# Patient Record
Sex: Male | Born: 1955 | Race: White | Hispanic: No | Marital: Single
Health system: Southern US, Community
[De-identification: ages and names within clinical notes are randomized; demographics above are authoritative.]

## PROBLEM LIST (undated history)

## (undated) DIAGNOSIS — F1010 Alcohol abuse, uncomplicated: Secondary | ICD-10-CM

## (undated) DIAGNOSIS — F209 Schizophrenia, unspecified: Secondary | ICD-10-CM

---

## 2002-05-26 ENCOUNTER — Inpatient Hospital Stay (HOSPITAL_COMMUNITY): Admission: EM | Admit: 2002-05-26 | Discharge: 2002-05-29 | Payer: Self-pay | Admitting: Emergency Medicine

## 2002-05-26 ENCOUNTER — Encounter: Payer: Self-pay | Admitting: Emergency Medicine

## 2002-12-25 ENCOUNTER — Inpatient Hospital Stay (HOSPITAL_COMMUNITY): Admission: EM | Admit: 2002-12-25 | Discharge: 2002-12-28 | Payer: Self-pay | Admitting: Emergency Medicine

## 2003-01-28 ENCOUNTER — Emergency Department (HOSPITAL_COMMUNITY): Admission: EM | Admit: 2003-01-28 | Discharge: 2003-01-28 | Payer: Self-pay | Admitting: Emergency Medicine

## 2003-07-30 ENCOUNTER — Inpatient Hospital Stay (HOSPITAL_COMMUNITY): Admission: EM | Admit: 2003-07-30 | Discharge: 2003-08-01 | Payer: Self-pay | Admitting: Emergency Medicine

## 2003-08-23 ENCOUNTER — Inpatient Hospital Stay (HOSPITAL_COMMUNITY): Admission: EM | Admit: 2003-08-23 | Discharge: 2003-08-27 | Payer: Self-pay | Admitting: Emergency Medicine

## 2004-01-26 ENCOUNTER — Emergency Department (HOSPITAL_COMMUNITY): Admission: EM | Admit: 2004-01-26 | Discharge: 2004-01-27 | Payer: Self-pay | Admitting: Emergency Medicine

## 2004-04-16 ENCOUNTER — Inpatient Hospital Stay (HOSPITAL_COMMUNITY): Admission: EM | Admit: 2004-04-16 | Discharge: 2004-04-19 | Payer: Self-pay | Admitting: *Deleted

## 2004-04-16 ENCOUNTER — Ambulatory Visit: Payer: Self-pay | Admitting: Internal Medicine

## 2004-04-19 ENCOUNTER — Inpatient Hospital Stay: Payer: Self-pay | Admitting: Unknown Physician Specialty

## 2004-05-12 ENCOUNTER — Emergency Department (HOSPITAL_COMMUNITY): Admission: EM | Admit: 2004-05-12 | Discharge: 2004-05-12 | Payer: Self-pay | Admitting: Emergency Medicine

## 2004-05-13 ENCOUNTER — Emergency Department (HOSPITAL_COMMUNITY): Admission: EM | Admit: 2004-05-13 | Discharge: 2004-05-14 | Payer: Self-pay | Admitting: Emergency Medicine

## 2004-05-18 ENCOUNTER — Emergency Department (HOSPITAL_COMMUNITY): Admission: EM | Admit: 2004-05-18 | Discharge: 2004-05-18 | Payer: Self-pay | Admitting: Emergency Medicine

## 2004-06-14 ENCOUNTER — Emergency Department (HOSPITAL_COMMUNITY): Admission: EM | Admit: 2004-06-14 | Discharge: 2004-06-14 | Payer: Self-pay | Admitting: Emergency Medicine

## 2004-07-02 ENCOUNTER — Emergency Department (HOSPITAL_COMMUNITY): Admission: EM | Admit: 2004-07-02 | Discharge: 2004-07-02 | Payer: Self-pay | Admitting: Emergency Medicine

## 2005-01-10 ENCOUNTER — Emergency Department (HOSPITAL_COMMUNITY): Admission: EM | Admit: 2005-01-10 | Discharge: 2005-01-10 | Payer: Self-pay | Admitting: Emergency Medicine

## 2005-06-17 ENCOUNTER — Ambulatory Visit: Payer: Self-pay | Admitting: Internal Medicine

## 2005-06-30 ENCOUNTER — Ambulatory Visit (HOSPITAL_BASED_OUTPATIENT_CLINIC_OR_DEPARTMENT_OTHER): Admission: RE | Admit: 2005-06-30 | Discharge: 2005-06-30 | Payer: Self-pay | Admitting: Internal Medicine

## 2005-07-06 ENCOUNTER — Ambulatory Visit: Payer: Self-pay | Admitting: Internal Medicine

## 2005-07-15 ENCOUNTER — Ambulatory Visit: Payer: Self-pay | Admitting: Internal Medicine

## 2005-08-28 ENCOUNTER — Ambulatory Visit: Payer: Self-pay | Admitting: Internal Medicine

## 2007-01-25 IMAGING — CR DG CHEST 2V
3 series · 3 of 3 positions shown · non-contrast
Comparison: 04/18/04.

CLINICAL DATA: Shortness of breath and altered level of consciousness. 
 CHEST - 2 VIEW:

[view not recorded (1 of 3)]
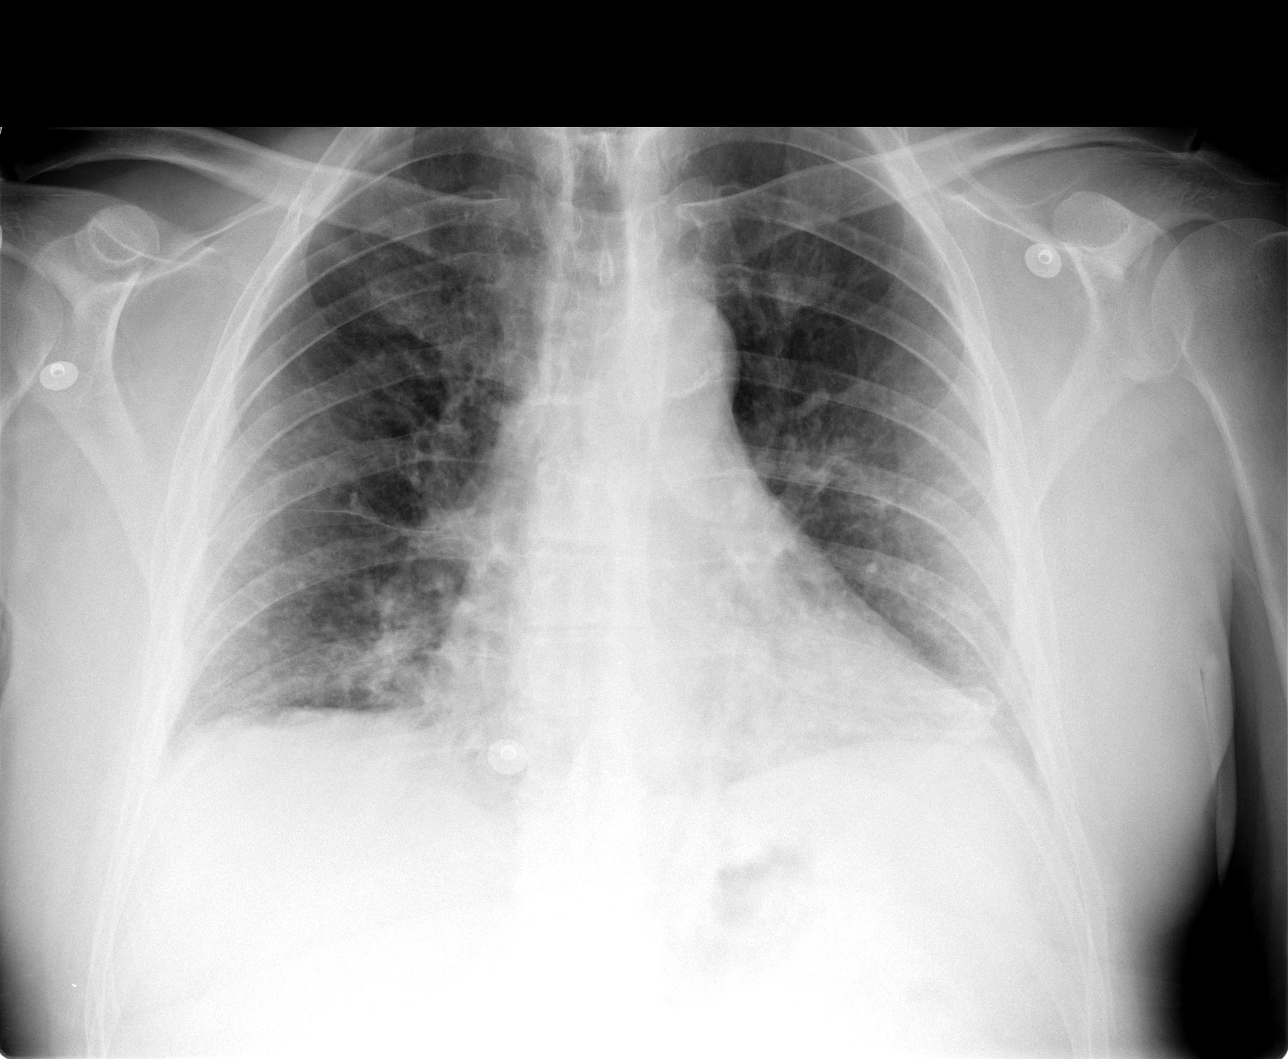

[view not recorded (2 of 3)]
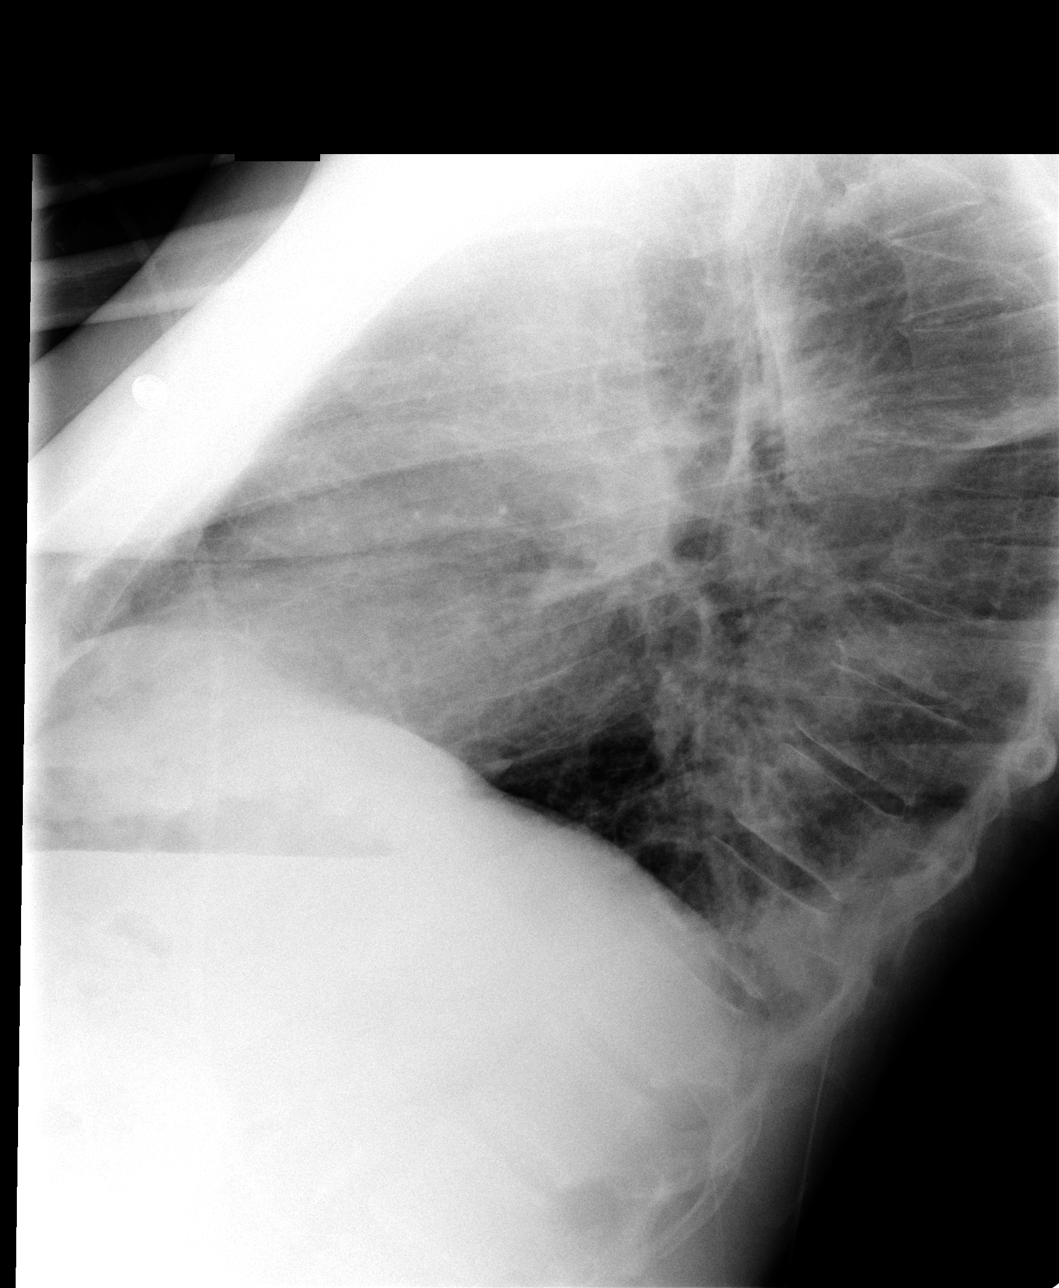

[view not recorded (3 of 3)]
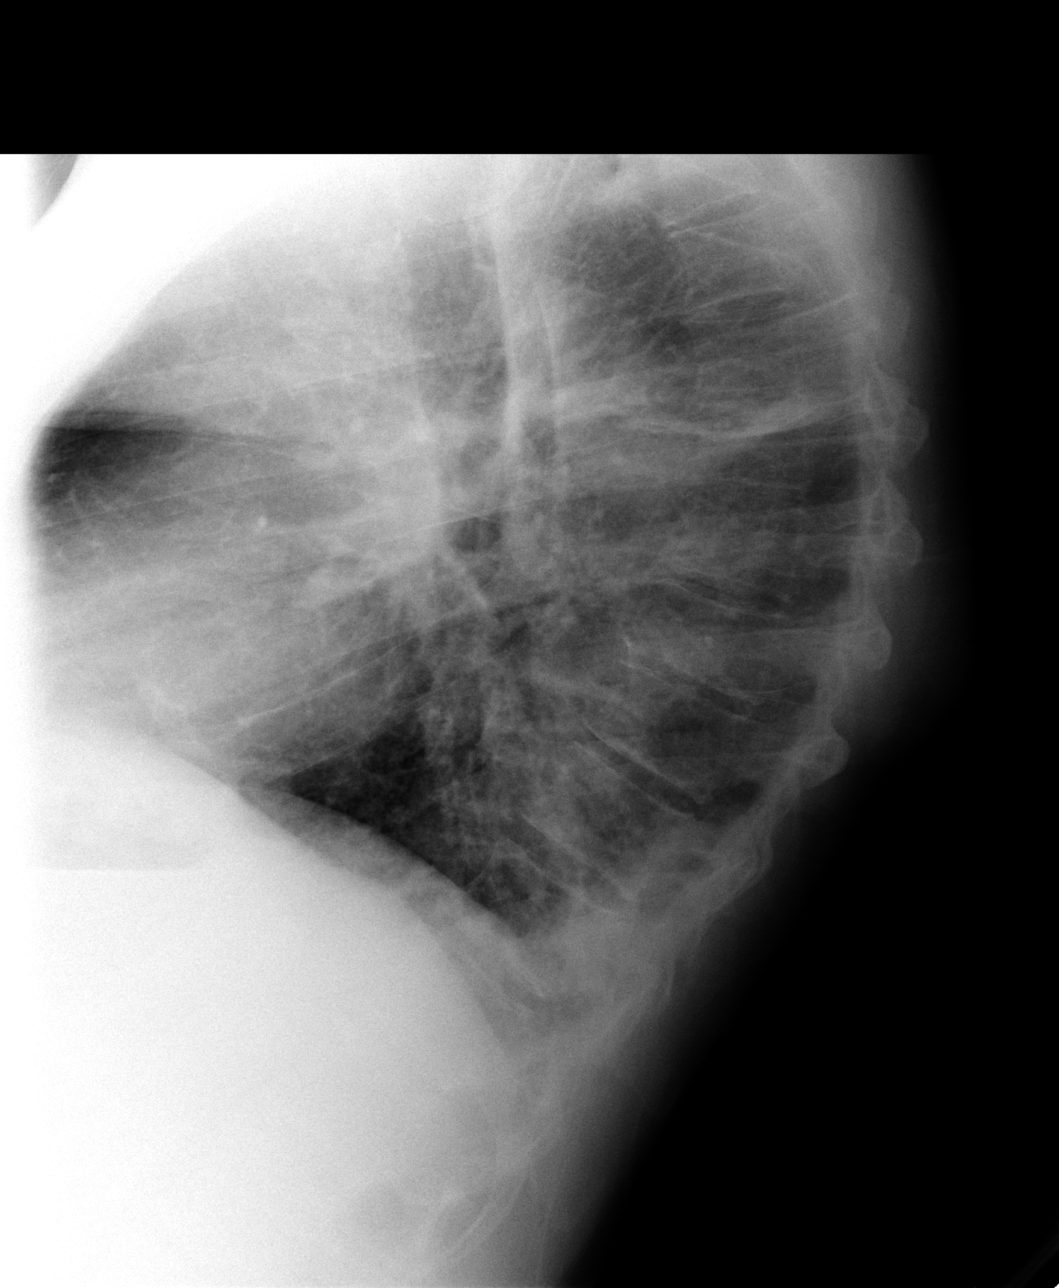

[3 of 3 positions shown; findings below may reference images not displayed]

FINDINGS: The heart is at the upper limits of normal in size.  Pulmonary vascularity is normal.  Intersitial markings are diffusely accentuated on a chronic basis, but appear slightly more accentuated than previously.  There is a focal area of increased density posteriorly, probably on the right which is new since 04/16/04 and may represent an acute infiltrate. 
 The patient also appears to have a small hiatal hernia.
IMPRESSION: Fairly severe chronic interstitial lung disease.  Focal area of probable infiltrate posteriorly on the lateral view.  Follow-up is recommended to ensure clearing since this could represent an ill-defined mass as well.

## 2007-01-25 IMAGING — CT CT HEAD W/O CM
1 of 2 series · 16 of 30 positions shown, 20 images · IV contrast (agent unspecified)
Comparison: 04/16/04.

CLINICAL DATA: Altered level of consciousness.  Chest pain.  
 HEAD CT WITHOUT CONTRAST:
TECHNIQUE: Contiguous axial images were obtained from the base of the skull through the vertex according to standard protocol without contrast.

[Series 3: routine head 4.8 h45s · axial · 0.47mm/px · z∈[-131,-5]mm · 16 of 30 slices shown, 20 images]
[im 2/30  brain]
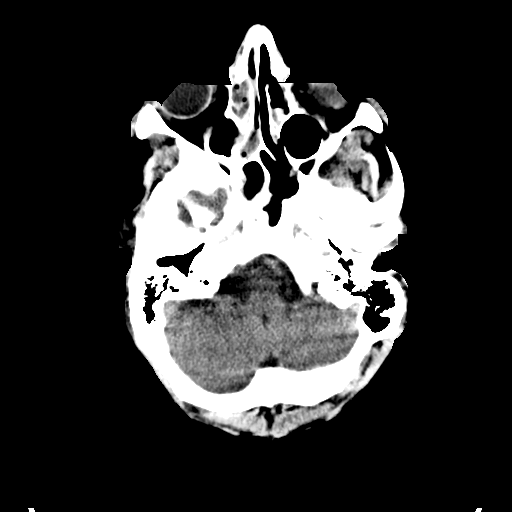
[im 2/30  bone]
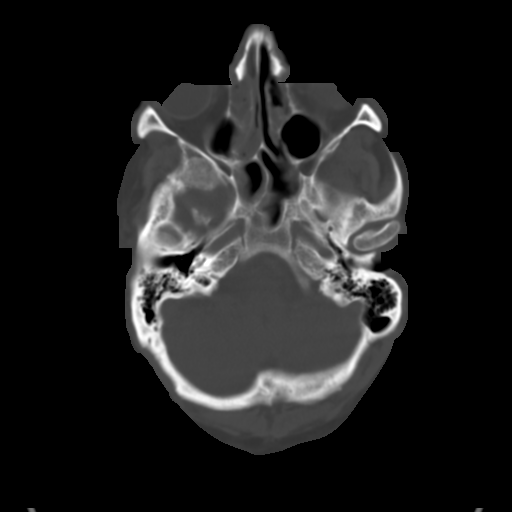
[im 4/30  brain]
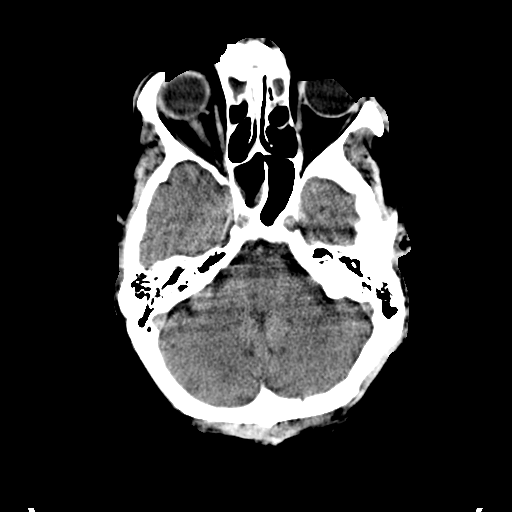
[im 5/30  brain]
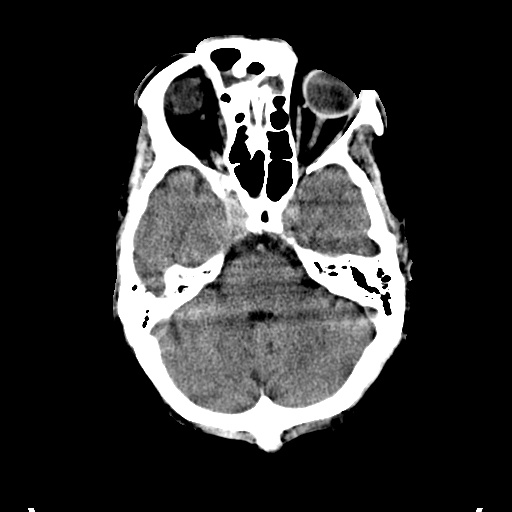
[im 8/30  brain]
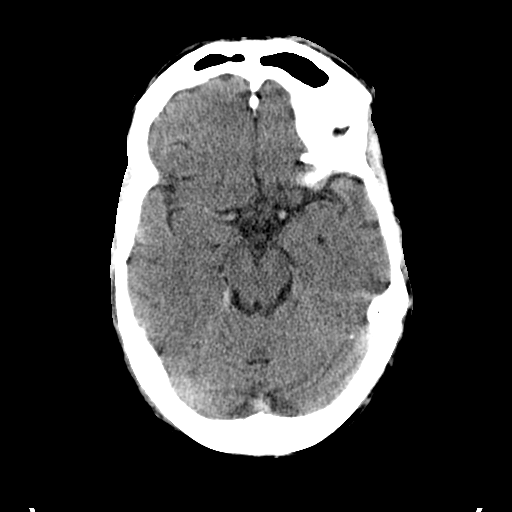
[im 9/30  brain]
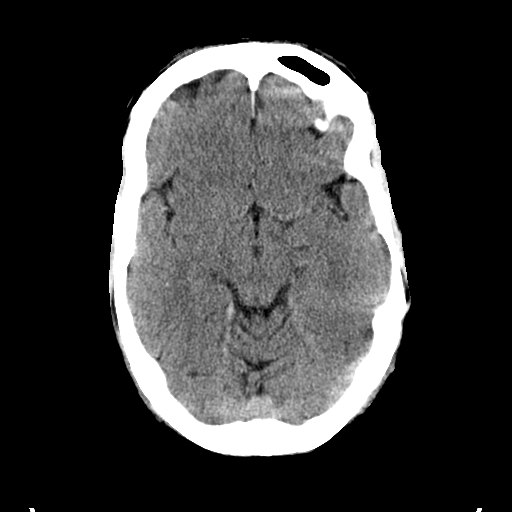
[im 9/30  bone]
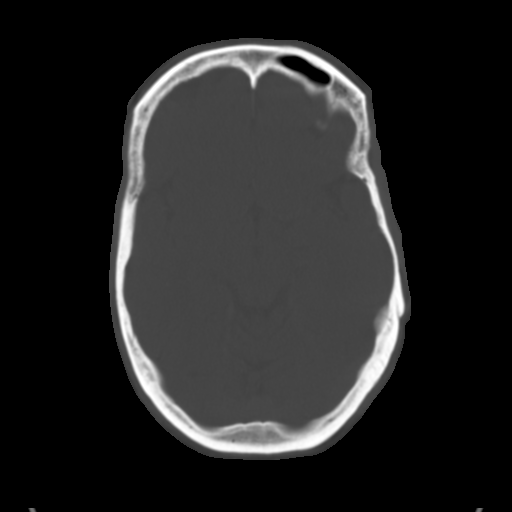
[im 10/30  brain]
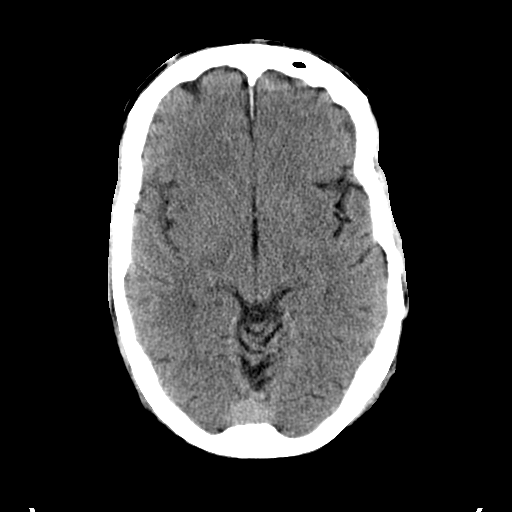
[im 13/30  brain]
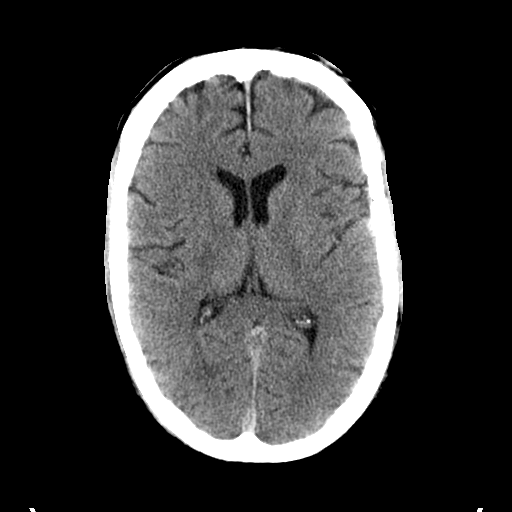
[im 14/30  brain]
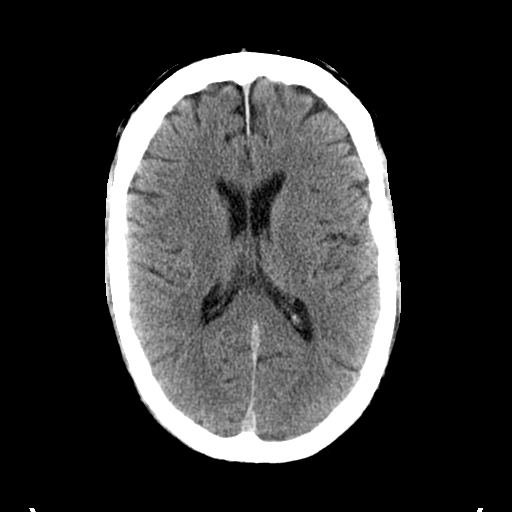
[im 16/30  brain]
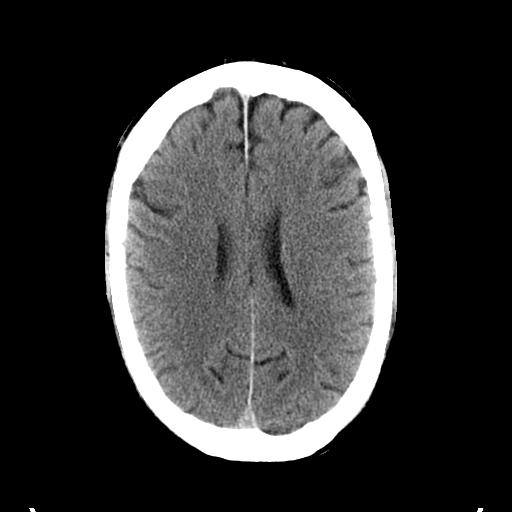
[im 16/30  bone]
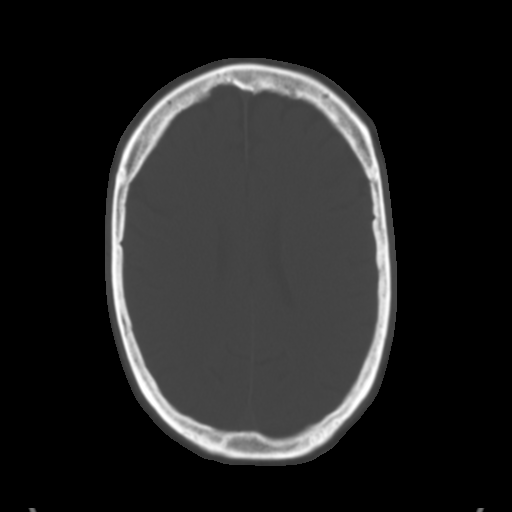
[im 17/30  brain]
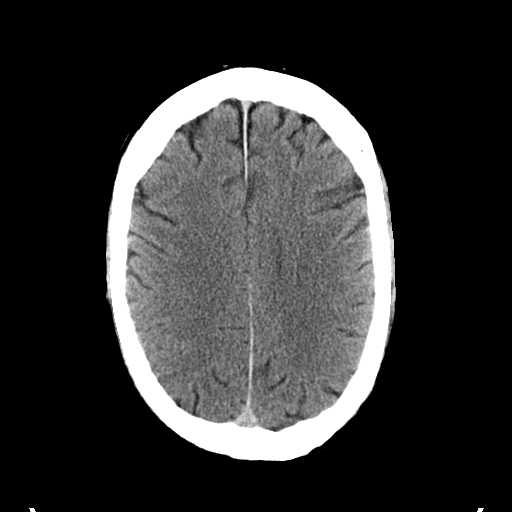
[im 20/30  brain]
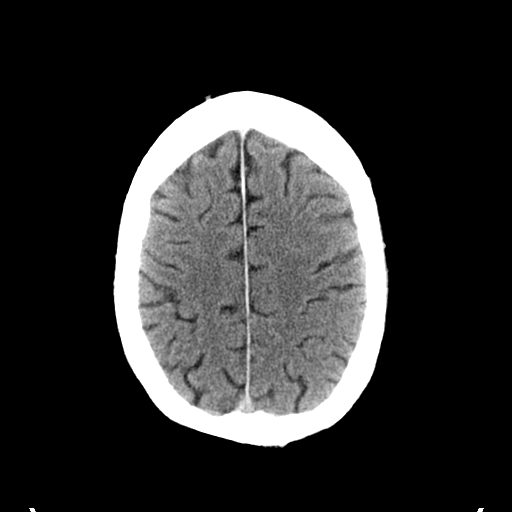
[im 21/30  brain]
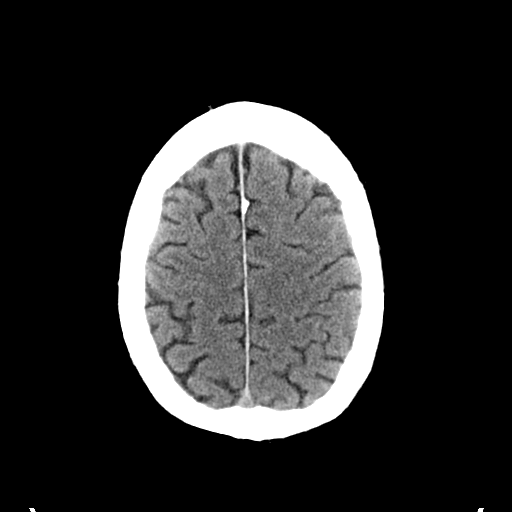
[im 22/30  brain]
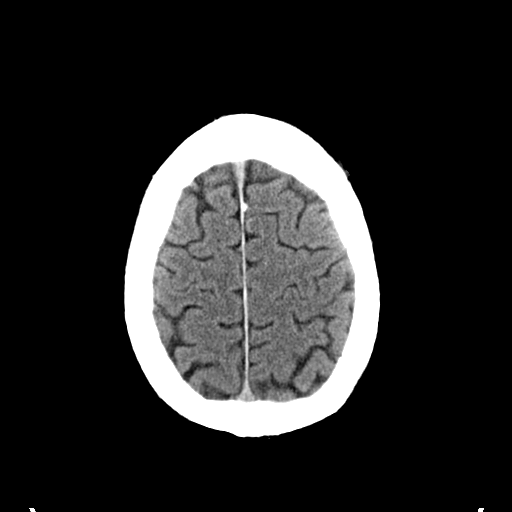
[im 22/30  bone]
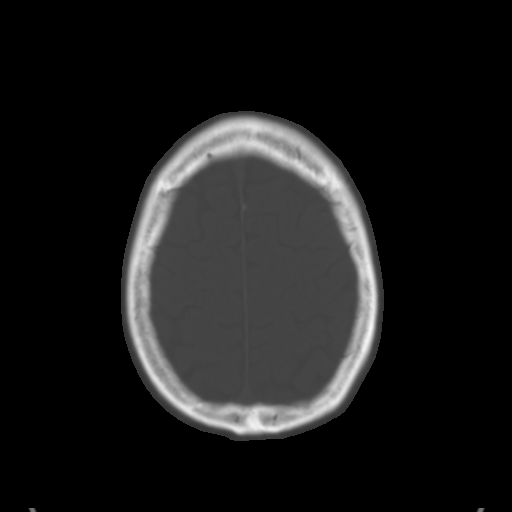
[im 25/30  brain]
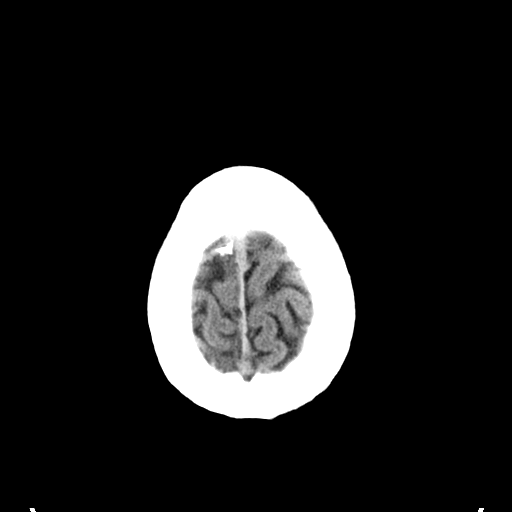
[im 26/30  brain]
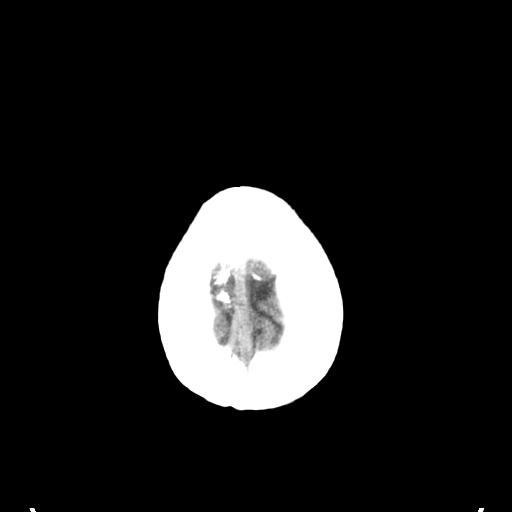
[im 28/30  brain]
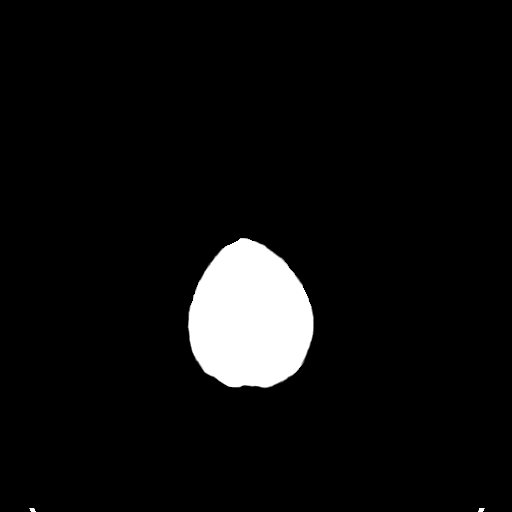

[16 of 30 positions shown; findings below may reference images not displayed]

FINDINGS: There is no evidence for acute hemorrhage, hydrocephalus, mass effect, or abnormal extraaxial fluid collection.  No definite CT evidence for acute ischemia is identified.  Bone windows reveal prominent mucosal thickening in the ethmoid air cells with circumferential disease in the right maxillary and sphenoid sinuses.
IMPRESSION: 1.  No acute intracranial abnormality. 
 2.  Paranasal sinus disease.

## 2016-05-12 ENCOUNTER — Emergency Department (HOSPITAL_COMMUNITY)
Admission: EM | Admit: 2016-05-12 | Payer: Self-pay | Source: Home / Self Care | Attending: Emergency Medicine | Admitting: Emergency Medicine

## 2016-05-12 ENCOUNTER — Encounter (HOSPITAL_COMMUNITY): Payer: Self-pay | Admitting: Emergency Medicine

## 2016-05-12 HISTORY — DX: Schizophrenia, unspecified: F20.9

## 2016-05-12 HISTORY — DX: Alcohol abuse, uncomplicated: F10.10

## 2016-05-12 NOTE — ED Notes (Signed)
This patient was arrived by mistake and all information is to be transferred to the correct patient.

## 2016-05-12 NOTE — ED Notes (Signed)
Unable to perform CIWA due to patient being so intoxicated.  Patient not able to give urine sample at this time.

## 2016-05-12 NOTE — ED Triage Notes (Signed)
Patient IVC'd by ACTT team with history of ETOH abuse and has been currently using alcohol today.  ACTT team reports he has been drinking 1 1/2 fifths of whiskey and "40 beers."  Patient mumbling with staggering gait.  Was seen at [facility] earlier then brought here.

## 2016-05-12 NOTE — ED Provider Notes (Signed)
[facility]-EMERGENCY DEPT Provider Note   CSN: [id number] Arrival date & time: 05/12/16  1533     History   Chief Complaint Chief Complaint  Patient presents with  . Alcohol Intoxication, schizophrenia    HPI Russell Carrillo is a 61 y.o. male.  61 y/o w/ shizophrenia and etoh abuse presents under IVC due to excessive etoh use No abd pain or emesis He denies si/hi Last use of etoh was today--pt has no desire to stop drinking Denies responding to internal stimuli Recently had his psych meds adjusted which he states has made his schizphrenia sx worse Went to [place] and was sent here for tx      Past Medical History:  Diagnosis Date  . ETOH abuse   . Schizophrenia (HCC)     There are no active problems to display for this patient.   History reviewed. No pertinent surgical history.     Home Medications    Prior to Admission medications   Not on File    Family History No family history on file.  Social History Social History  Substance Use Topics  . Smoking status: Not on file  . Smokeless tobacco: Not on file  . Alcohol use Not on file     Allergies   Patient has no allergy information on record.   Review of Systems Review of Systems  All other systems reviewed and are negative.    Physical Exam Updated Vital Signs There were no vitals taken for this visit.  Physical Exam  Constitutional: He is oriented to person, place, and time. He appears well-developed and well-nourished.  Non-toxic appearance. No distress.  HENT:  Head: Normocephalic and atraumatic.  Eyes: Conjunctivae, EOM and lids are normal. Pupils are equal, round, and reactive to light.  Neck: Normal range of motion. Neck supple. No tracheal deviation present. No thyroid mass present.  Cardiovascular: Normal rate, regular rhythm and normal heart sounds.  Exam reveals no gallop.   No murmur heard. Pulmonary/Chest: Effort normal and breath sounds normal. No stridor. No respiratory distress.  He has no decreased breath sounds. He has no wheezes. He has no rhonchi. He has no rales.  Abdominal: Soft. Normal appearance and bowel sounds are normal. He exhibits no distension. There is no tenderness. There is no rebound and no CVA tenderness.  Musculoskeletal: Normal range of motion. He exhibits no edema or tenderness.  Neurological: He is alert and oriented to person, place, and time. He has normal strength. No cranial nerve deficit or sensory deficit. GCS eye subscore is 4. GCS verbal subscore is 5. GCS motor subscore is 6.  Skin: Skin is warm and dry. No abrasion and no rash noted.  Psychiatric: His speech is normal and behavior is normal. His affect is blunt. He expresses no homicidal and no suicidal ideation. He expresses no suicidal plans and no homicidal plans.  Nursing note and vitals reviewed.    ED Treatments / Results  Labs (all labs ordered are listed, but only abnormal results are displayed) Labs Reviewed  CBC WITH DIFFERENTIAL/PLATELET  COMPREHENSIVE METABOLIC PANEL  ETHANOL  RAPID URINE DRUG SCREEN, HOSP PERFORMED    EKG  EKG Interpretation None       Radiology No results found.  Procedures Procedures (including critical care time)  Medications Ordered in ED Medications - No data to display   Initial Impression / Assessment and Plan / ED Course  I have reviewed the triage vital signs and the nursing notes.  Pertinent labs & imaging results  that were available during my care of the patient were reviewed by me and considered in my medical decision making (see chart for details).     The patient was registered under the wrong name a new chart was made.  Final Clinical Impressions(s) / ED Diagnoses   Final diagnoses:  None    New Prescriptions New Prescriptions   No medications on file     Lorre Nick, MD 05/12/16 1916

## 2016-05-14 LAB — CBC WITH DIFFERENTIAL/PLATELET

## 2016-05-14 LAB — COMPREHENSIVE METABOLIC PANEL

## 2016-05-14 LAB — RAPID URINE DRUG SCREEN, HOSP PERFORMED

## 2016-05-14 LAB — ETHANOL

## 2018-02-16 ENCOUNTER — Emergency Department (HOSPITAL_COMMUNITY): Admission: EM | Admit: 2018-02-16 | Discharge: 2018-02-16 | Discharge status: Absent without leave | Payer: Self-pay

## 2018-02-16 NOTE — ED Triage Notes (Deleted)
Pt brought in from home by [facility] with "seeing things". Pt reported to EMS that he drank 2-3 cases of beer today. Pt appears to be very intoxicated.

## 2018-02-16 NOTE — ED Triage Notes (Signed)
Arrived pt to ED in error.
